# Patient Record
Sex: Male | Born: 1935 | Race: White | Hispanic: No | Marital: Married | Smoking: Never smoker
Health system: Southern US, Community
[De-identification: ages and names within clinical notes are randomized; demographics above are authoritative.]

## PROBLEM LIST (undated history)

## (undated) DIAGNOSIS — C189 Malignant neoplasm of colon, unspecified: Secondary | ICD-10-CM

## (undated) DIAGNOSIS — G4730 Sleep apnea, unspecified: Secondary | ICD-10-CM

## (undated) DIAGNOSIS — K08109 Complete loss of teeth, unspecified cause, unspecified class: Secondary | ICD-10-CM

## (undated) DIAGNOSIS — K000 Anodontia: Secondary | ICD-10-CM

## (undated) DIAGNOSIS — R410 Disorientation, unspecified: Secondary | ICD-10-CM

## (undated) DIAGNOSIS — K5790 Diverticulosis of intestine, part unspecified, without perforation or abscess without bleeding: Secondary | ICD-10-CM

## (undated) DIAGNOSIS — I639 Cerebral infarction, unspecified: Secondary | ICD-10-CM

## (undated) DIAGNOSIS — E119 Type 2 diabetes mellitus without complications: Secondary | ICD-10-CM

## (undated) DIAGNOSIS — M109 Gout, unspecified: Secondary | ICD-10-CM

## (undated) DIAGNOSIS — IMO0001 Reserved for inherently not codable concepts without codable children: Secondary | ICD-10-CM

## (undated) DIAGNOSIS — F32A Depression, unspecified: Secondary | ICD-10-CM

## (undated) DIAGNOSIS — I2510 Atherosclerotic heart disease of native coronary artery without angina pectoris: Secondary | ICD-10-CM

## (undated) DIAGNOSIS — F329 Major depressive disorder, single episode, unspecified: Secondary | ICD-10-CM

## (undated) DIAGNOSIS — E7800 Pure hypercholesterolemia, unspecified: Secondary | ICD-10-CM

## (undated) DIAGNOSIS — I10 Essential (primary) hypertension: Secondary | ICD-10-CM

## (undated) DIAGNOSIS — G629 Polyneuropathy, unspecified: Secondary | ICD-10-CM

## (undated) DIAGNOSIS — H9190 Unspecified hearing loss, unspecified ear: Secondary | ICD-10-CM

## (undated) HISTORY — PX: COLON RESECTION: SHX5231

## (undated) HISTORY — PX: COLONOSCOPY: SHX174

## (undated) HISTORY — PX: CARDIAC CATHETERIZATION: SHX172

---

## 2004-10-01 ENCOUNTER — Ambulatory Visit: Admission: RE | Admit: 2004-10-01 | Discharge: 2004-10-01 | Payer: Self-pay | Admitting: Ophthalmology

## 2007-06-28 ENCOUNTER — Ambulatory Visit (HOSPITAL_COMMUNITY): Admission: RE | Admit: 2007-06-28 | Discharge: 2007-06-28 | Payer: Self-pay | Admitting: Ophthalmology

## 2007-07-06 ENCOUNTER — Encounter (INDEPENDENT_AMBULATORY_CARE_PROVIDER_SITE_OTHER): Payer: Self-pay | Admitting: Ophthalmology

## 2007-07-06 ENCOUNTER — Ambulatory Visit (HOSPITAL_COMMUNITY): Admission: RE | Admit: 2007-07-06 | Discharge: 2007-07-06 | Payer: Self-pay | Admitting: Ophthalmology

## 2011-05-11 NOTE — Op Note (Signed)
NAME:  Stanley Zuniga, Stanley Zuniga              ACCOUNT NO.:  [id number]   MEDICAL RECORD NO.:  [id number]          PATIENT TYPE:  AMB   LOCATION:  SDS                          FACILITY:  [facility]   PHYSICIAN:  Timothy R. Vonna Kotyk, MD   DATE OF BIRTH:  [birth year]   DATE OF PROCEDURE:  DATE OF DISCHARGE:  07/06/2007                               OPERATIVE REPORT   PREOPERATIVE DIAGNOSIS:  Blind, painful eye, left eye.   POSTOPERATIVE DIAGNOSIS:  Blind, painful eye, left eye.   PROCEDURES PERFORMED:  Enucleation of the left eye with a 22-mm  prosthetic  placed into the left eye.   ANESTHESIA:  General anesthesia.   INDICATIONS FOR PROCEDURE:  This is a 76 year old gentleman with a left  eye which has been blind and painful and does not respond to  medications.  Risks and benefits of having the eye removed with the  patient.  The patient elected to have the eye removed.   DESCRIPTION OF PROCEDURE:  The patient was identified preoperatively,  __________ and the left eye was identified bythe surgeon.  The patient  was brought back to the operating suite, released and markers were  placed, and the patient was placed under general anesthesia.  At that  time, the eye was prepped and draped in the usual sterile fashion for  microphthalmic surgery, including __________ with povidone iodine to the  superior and inferior conjunctival fornices.  A __________ was placed.  Surgery was initiated.  A 360-degree conjunctival peritomy was performed  with the use of 0.12 Westcott scissors.  The conjunctiva and sclera were  dissected off the globe with the use of Westcott scissors.  The inferior  rectus was then isolated with a muscle hook and was imbricated with a  double-armed 6-0 Vicryl suture.  The muscle was then removed from the  eyeball, and this was continued with the medial rectus, superior rectus,  and lateral rectus.  Once all 4 recti muscles were isolated and  imbricated, the oblique muscles were removed  from the globe, and the  globe was removed from the socket.  The optic nerve was cut with  scissors.  The specimen was sent off to pathology.   Hemostasis was achieved with thrombin and pressure hemostasis.  Once the  hemostasis was achieved, the eyeball was measured in its greatest  diameter, and transverse diameter was 25 mm, and a 22-mm prosthetic was  placed into the socket.  The medial rectus was tied to the lateral  rectus, and the inferior rectus was tied to the superior rectus without  complication.  The Tenons were then sutured closed in a locking running  fashion with 8-0 Vicryl, and then the conjunctiva was closed with 8-0  Vicryl in a running fashion.  The patient then received intravenous  Avelox 400 mg and subconjunctival gentamicin, dexamethasone, and  lidocaine.  TobraDex ointment was then placed in the eye.  A conformer was placed in the eye, and the eye was pressure patched, and  the patient was taken to the recovery room in stable condition.   INSTRUCTIONS:  Follow up with Dr. Daphine Deutscher,  postop day 1 care.           ______________________________  Ozzie Hoyle. Vonna Kotyk, MD     TRB/MEDQ  D:  07/06/2007  T:  07/07/2007  Job:  [id number]

## 2011-10-12 LAB — CBC
HCT: 37.2 — ABNORMAL LOW
Hemoglobin: 12.8 — ABNORMAL LOW
MCHC: 34.5
MCV: 90.1
Platelets: 143 — ABNORMAL LOW
RBC: 4.13 — ABNORMAL LOW
RDW: 17.2 — ABNORMAL HIGH
WBC: 3.8 — ABNORMAL LOW

## 2011-10-12 LAB — COMPREHENSIVE METABOLIC PANEL
ALT: 14
AST: 24
Albumin: 4
Alkaline Phosphatase: 32 — ABNORMAL LOW
BUN: 14
CO2: 25
Calcium: 9.1
Chloride: 106
Creatinine, Ser: 0.75
GFR calc Af Amer: >60
GFR calc non Af Amer: >60
Glucose, Bld: 90
Potassium: 4.1
Sodium: 135
Total Bilirubin: 0.6
Total Protein: 6.8

## 2011-10-12 LAB — PROTIME-INR
INR: 1.1
Prothrombin Time: 14.1

## 2011-10-12 LAB — APTT: aPTT: 29

## 2016-07-29 ENCOUNTER — Encounter: Payer: Self-pay | Admitting: *Deleted

## 2016-08-06 NOTE — Discharge Instructions (Signed)
INSTRUCTIONS FOLLOWING OCULOPLASTIC SURGERY °AMY M. FOWLER, MD ° °AFTER YOUR EYE SURGERY, THER ARE MANY THINGS THWIHC YOU, THE PATIENT, CAN DO TO ASSURE THE BEST POSSIBLE RESULT FROM YOUR OPERATION.  THIS SHEET SHOULD BE REFERRED TO WHENEVER QUESTIONS ARISE.  IF THERE ARE ANY QUESTIONS NOT ANSWERED HERE, DO NOT HESITATE TO CALL OUR OFFICE AT [phone] OR [phone].  THERE IS ALWAYS [facility] AVAILABLE TO CALL IF QUESTIONS OR PROBLEMS ARISE. ° °VISION: Your vision may be blurred and out of focus after surgery until you are able to stop using your ointment, swelling resolves and your eye(s) heal. This may take 1 to 2 weeks at the least.  If your vision becomes gradually more dim or dark, this is not normal and you need to call our office immediately. ° °EYE CARE: For the first 48 hours after surgery, use ice packs frequently - “20 minutes on, 20 minutes off” - to help reduce swelling and bruising.  Small bags of frozen peas or corn make good ice packs along with cloths soaked in ice water.  If you are wearing a patch or other type of dressing following surgery, keep this on for the amount of time specified by your doctor.  For the first week following surgery, you will need to treat your stitches with great care.  If is OK to shower, but take care to not allow soapy water to run into your eye(s) to help reduce changes of infection.  You may gently clean the eyelashes and around the eye(s) with cotton balls and sterile water, BUT DO NOT RUB THE STITCHES VIGOROUSLY.  Keeping your stitches moist with ointment will help promote healing with minimal scar formation. ° °ACTIVITY: When you leave the surgery center, you should go home, rest and be inactive.  The eye(s) may feel scratchy and keeping the eyes closed will allow for faster healing.  The first week following surgery, avoid straining (anything making the face turn red) or lifting over 20 pounds.  Additionally, avoid bending which causes your head to go below  your waist.  Using your eyes will NOT harm them, so feel free to read, watch television, use the computer, etc as desired.  Driving depends on each individual, so check with your doctor if you have questions about driving. ° °MEDICATIONS:  You will be given a prescription for an ointment to use 4 times a day on your stitches.  You can use the ointment in your eyes if they feel scratchy or irritated.  If you eyelid(s) don’t close completely when you sleep, put some ointment in your eyes before bedtime. ° °EMERGENCY: If you experience SEVERE EYE PAIN OR HEADACHE UNRELIEVED BY TYLENOL OR PERCOCET, NAUSEA OR VOMITING, WORSENING REDNESS, OR WORSENING VISION (ESPECIALLY VISION THAT WA INITIALLY BETTER) CALL [phone] OR [phone] DURING BUSINESS HOURS OR AFTER HOURS. ° °General Anesthesia, Adult, Care After °Refer to this sheet in the next few weeks. These instructions provide you with information on caring for yourself after your procedure. Your health care provider may also give you more specific instructions. Your treatment has been planned according to current medical practices, but problems sometimes occur. Call your health care provider if you have any problems or questions after your procedure. °WHAT TO EXPECT AFTER THE PROCEDURE °After the procedure, it is typical to experience: °· Sleepiness. °· Nausea and vomiting. °HOME CARE INSTRUCTIONS °· For the first 24 hours after general anesthesia: °¨ Have a responsible person with you. °¨ Do not drive a car. If you   are alone, do not take public transportation. °¨ Do not drink alcohol. °¨ Do not take medicine that has not been prescribed by your health care provider. °¨ Do not sign important papers or make important decisions. °¨ You may resume a normal diet and activities as directed by your health care provider. °· Change bandages (dressings) as directed. °· If you have questions or problems that seem related to general anesthesia, call the hospital and ask for  the anesthetist or anesthesiologist on call. °SEEK MEDICAL CARE IF: °· You have nausea and vomiting that continue the day after anesthesia. °· You develop a rash. °SEEK IMMEDIATE MEDICAL CARE IF:  °· You have difficulty breathing. °· You have chest pain. °· You have any allergic problems. °  °This information is not intended to replace advice given to you by your health care provider. Make sure you discuss any questions you have with your health care provider. °  °Document Released: 03/21/2001 Document Revised: 01/03/2015 Document Reviewed: 04/12/2012 °Elsevier Interactive Patient Education ©2016 [employer] ° °

## 2016-08-10 ENCOUNTER — Ambulatory Visit: Payer: Medicare Other | Admitting: Student in an Organized Health Care Education/Training Program

## 2016-08-10 ENCOUNTER — Encounter: Payer: Self-pay | Admitting: Anesthesiology

## 2016-08-10 ENCOUNTER — Encounter
Admission: RE | Disposition: A | Discharge status: Home / Self Care | Payer: Self-pay | Source: Ambulatory Visit | Attending: Ophthalmology

## 2016-08-10 ENCOUNTER — Ambulatory Visit
Admission: RE | Admit: 2016-08-10 | Discharge: 2016-08-10 | Disposition: A | Discharge status: Home / Self Care | Payer: Medicare Other | Source: Ambulatory Visit | Attending: Ophthalmology | Admitting: Ophthalmology

## 2016-08-10 DIAGNOSIS — M109 Gout, unspecified: Secondary | ICD-10-CM | POA: Insufficient documentation

## 2016-08-10 DIAGNOSIS — K5790 Diverticulosis of intestine, part unspecified, without perforation or abscess without bleeding: Secondary | ICD-10-CM | POA: Diagnosis not present

## 2016-08-10 DIAGNOSIS — G4730 Sleep apnea, unspecified: Secondary | ICD-10-CM | POA: Insufficient documentation

## 2016-08-10 DIAGNOSIS — Z8673 Personal history of transient ischemic attack (TIA), and cerebral infarction without residual deficits: Secondary | ICD-10-CM | POA: Insufficient documentation

## 2016-08-10 DIAGNOSIS — I2510 Atherosclerotic heart disease of native coronary artery without angina pectoris: Secondary | ICD-10-CM | POA: Diagnosis not present

## 2016-08-10 DIAGNOSIS — Z885 Allergy status to narcotic agent status: Secondary | ICD-10-CM | POA: Insufficient documentation

## 2016-08-10 DIAGNOSIS — Z955 Presence of coronary angioplasty implant and graft: Secondary | ICD-10-CM | POA: Insufficient documentation

## 2016-08-10 DIAGNOSIS — I10 Essential (primary) hypertension: Secondary | ICD-10-CM | POA: Diagnosis not present

## 2016-08-10 DIAGNOSIS — F329 Major depressive disorder, single episode, unspecified: Secondary | ICD-10-CM | POA: Diagnosis not present

## 2016-08-10 DIAGNOSIS — Z9889 Other specified postprocedural states: Secondary | ICD-10-CM | POA: Diagnosis not present

## 2016-08-10 DIAGNOSIS — Z79899 Other long term (current) drug therapy: Secondary | ICD-10-CM | POA: Insufficient documentation

## 2016-08-10 DIAGNOSIS — Z7984 Long term (current) use of oral hypoglycemic drugs: Secondary | ICD-10-CM | POA: Insufficient documentation

## 2016-08-10 DIAGNOSIS — Z7982 Long term (current) use of aspirin: Secondary | ICD-10-CM | POA: Insufficient documentation

## 2016-08-10 DIAGNOSIS — Z85038 Personal history of other malignant neoplasm of large intestine: Secondary | ICD-10-CM | POA: Diagnosis not present

## 2016-08-10 DIAGNOSIS — H02105 Unspecified ectropion of left lower eyelid: Secondary | ICD-10-CM | POA: Diagnosis not present

## 2016-08-10 DIAGNOSIS — E1140 Type 2 diabetes mellitus with diabetic neuropathy, unspecified: Secondary | ICD-10-CM | POA: Diagnosis not present

## 2016-08-10 DIAGNOSIS — H02402 Unspecified ptosis of left eyelid: Secondary | ICD-10-CM | POA: Diagnosis not present

## 2016-08-10 DIAGNOSIS — E7800 Pure hypercholesterolemia, unspecified: Secondary | ICD-10-CM | POA: Insufficient documentation

## 2016-08-10 DIAGNOSIS — Z9049 Acquired absence of other specified parts of digestive tract: Secondary | ICD-10-CM | POA: Diagnosis not present

## 2016-08-10 HISTORY — DX: Gout, unspecified: M10.9

## 2016-08-10 HISTORY — DX: Atherosclerotic heart disease of native coronary artery without angina pectoris: I25.10

## 2016-08-10 HISTORY — DX: Essential (primary) hypertension: I10

## 2016-08-10 HISTORY — DX: Type 2 diabetes mellitus without complications: E11.9

## 2016-08-10 HISTORY — DX: Sleep apnea, unspecified: G47.30

## 2016-08-10 HISTORY — DX: Major depressive disorder, single episode, unspecified: F32.9

## 2016-08-10 HISTORY — PX: ENTROPIAN REPAIR: SHX1512

## 2016-08-10 HISTORY — DX: Unspecified hearing loss, unspecified ear: H91.90

## 2016-08-10 HISTORY — DX: Reserved for inherently not codable concepts without codable children: IMO0001

## 2016-08-10 HISTORY — DX: Disorientation, unspecified: R41.0

## 2016-08-10 HISTORY — DX: Complete loss of teeth, unspecified cause, unspecified class: K08.109

## 2016-08-10 HISTORY — PX: PTOSIS REPAIR: SHX6568

## 2016-08-10 HISTORY — DX: Depression, unspecified: F32.A

## 2016-08-10 HISTORY — DX: Diverticulosis of intestine, part unspecified, without perforation or abscess without bleeding: K57.90

## 2016-08-10 HISTORY — DX: Pure hypercholesterolemia, unspecified: E78.00

## 2016-08-10 HISTORY — DX: Malignant neoplasm of colon, unspecified: C18.9

## 2016-08-10 HISTORY — DX: Polyneuropathy, unspecified: G62.9

## 2016-08-10 HISTORY — DX: Anodontia: K00.0

## 2016-08-10 LAB — GLUCOSE, CAPILLARY
GLUCOSE-CAPILLARY: 93 mg/dL (ref 65–99)
Glucose-Capillary: 89 mg/dL (ref 65–99)

## 2016-08-10 SURGERY — REPAIR, BLEPHAROPTOSIS
Anesthesia: Monitor Anesthesia Care | Site: Eye | Laterality: Left | Wound class: Clean

## 2016-08-10 MED ORDER — ERYTHROMYCIN 5 MG/GM OP OINT
TOPICAL_OINTMENT | OPHTHALMIC | Status: DC | PRN
  Administered 2016-08-10: 1 via OPHTHALMIC

## 2016-08-10 MED ORDER — LACTATED RINGERS IV SOLN
INTRAVENOUS | Status: DC
  Administered 2016-08-10: 07:00:00 via INTRAVENOUS

## 2016-08-10 MED ORDER — LIDOCAINE-EPINEPHRINE 2 %-1:100000 IJ SOLN
INTRAMUSCULAR | Status: DC | PRN
  Administered 2016-08-10: 2.5 mL via OPHTHALMIC

## 2016-08-10 MED ORDER — BSS IO SOLN
INTRAOCULAR | Status: DC | PRN
  Administered 2016-08-10: 15 mL via INTRAOCULAR

## 2016-08-10 MED ORDER — TETRACAINE HCL 0.5 % OP SOLN
OPHTHALMIC | Status: DC | PRN
  Administered 2016-08-10: 1 [drp] via OPHTHALMIC
  Administered 2016-08-10 (×2): 2 [drp] via OPHTHALMIC

## 2016-08-10 MED ORDER — OXYCODONE-ACETAMINOPHEN 5-325 MG PO TABS: 1.0000 | ORAL_TABLET | ORAL | 0 refills | Status: DC | PRN

## 2016-08-10 MED ORDER — LIDOCAINE HCL (CARDIAC) 20 MG/ML IV SOLN
INTRAVENOUS | Status: DC | PRN
  Administered 2016-08-10: 10 mg via INTRAVENOUS

## 2016-08-10 MED ORDER — PROPOFOL 500 MG/50ML IV EMUL
INTRAVENOUS | Status: DC | PRN
  Administered 2016-08-10: 25 ug/kg/min via INTRAVENOUS

## 2016-08-10 MED ORDER — ALFENTANIL 500 MCG/ML IJ INJ
INJECTION | INTRAMUSCULAR | Status: DC | PRN
  Administered 2016-08-10: 300 ug via INTRAVENOUS

## 2016-08-10 MED ORDER — ERYTHROMYCIN 5 MG/GM OP OINT: TOPICAL_OINTMENT | OPHTHALMIC | 3 refills | Status: DC

## 2016-08-10 SURGICAL SUPPLY — 38 items
APPLICATOR COTTON TIP WD 3 STR (MISCELLANEOUS) ×6 IMPLANT
BLADE SURG 15 STRL LF DISP TIS (BLADE) ×1 IMPLANT
BLADE SURG 15 STRL SS (BLADE) ×3
CORD BIP STRL DISP 12FT (MISCELLANEOUS) ×3 IMPLANT
DRAPE HEAD BAR (DRAPES) ×3 IMPLANT
GAUZE SPONGE 4X4 12PLY STRL (GAUZE/BANDAGES/DRESSINGS) ×3 IMPLANT
GAUZE SPONGE NON-WVN 2X2 STRL (MISCELLANEOUS) ×10 IMPLANT
GLOVE SURG LX 7.0 MICRO (GLOVE) ×4
GLOVE SURG LX STRL 7.0 MICRO (GLOVE) ×2 IMPLANT
MARKER SKIN XFINE TIP W/RULER (MISCELLANEOUS) ×3 IMPLANT
NDL FILTER BLUNT 18X1 1/2 (NEEDLE) ×1 IMPLANT
NDL HYPO 30X.5 LL (NEEDLE) ×2 IMPLANT
NEEDLE FILTER BLUNT 18X 1/2SAF (NEEDLE) ×2
NEEDLE FILTER BLUNT 18X1 1/2 (NEEDLE) ×1 IMPLANT
NEEDLE HYPO 30X.5 LL (NEEDLE) ×6 IMPLANT
PACK DRAPE NASAL/ENT (PACKS) ×3 IMPLANT
SOL PREP PVP 2OZ (MISCELLANEOUS) ×3
SOLUTION PREP PVP 2OZ (MISCELLANEOUS) ×1 IMPLANT
SPONGE VERSALON 2X2 STRL (MISCELLANEOUS) ×30
SUT CHROMIC 4-0 (SUTURE)
SUT CHROMIC 4-0 M2 12X2 ARM (SUTURE)
SUT CHROMIC 5 0 P 3 (SUTURE) IMPLANT
SUT ETHILON 4 0 CL P 3 (SUTURE) IMPLANT
SUT MERSILENE 4-0 S-2 (SUTURE) ×3 IMPLANT
SUT PDS AB 4-0 P3 18 (SUTURE) IMPLANT
SUT PLAIN GUT (SUTURE) ×3 IMPLANT
SUT PROLENE 5 0 P 3 (SUTURE) ×3 IMPLANT
SUT PROLENE 6 0 P 1 18 (SUTURE) ×3 IMPLANT
SUT SILK 4 0 G 3 (SUTURE) IMPLANT
SUT VIC AB 5-0 P-3 18X BRD (SUTURE) IMPLANT
SUT VIC AB 5-0 P3 18 (SUTURE)
SUT VICRYL 6-0  S14 CTD (SUTURE)
SUT VICRYL 6-0 S14 CTD (SUTURE) IMPLANT
SUT VICRYL 7 0 TG140 8 (SUTURE) IMPLANT
SUTURE CHRMC 4-0 M2 12X2 ARM (SUTURE) IMPLANT
SYR 3ML LL SCALE MARK (SYRINGE) ×3 IMPLANT
SYRINGE 10CC LL (SYRINGE) ×3 IMPLANT
WATER STERILE IRR 500ML POUR (IV SOLUTION) ×3 IMPLANT

## 2016-08-10 NOTE — Anesthesia Procedure Notes (Signed)
Procedure Name: MAC Date/Time: 08/10/2016 7:37 AM Performed by: Janna Arch Pre-anesthesia Checklist: Patient identified, Emergency Drugs available, Suction available, Patient being monitored and Timeout performed Patient Re-evaluated:Patient Re-evaluated prior to inductionOxygen Delivery Method: Nasal cannula

## 2016-08-10 NOTE — Anesthesia Preprocedure Evaluation (Addendum)
Anesthesia Evaluation    Airway Mallampati: III  TM Distance: >3 FB Neck ROM: Limited    Dental   Pulmonary shortness of breath and with exertion, sleep apnea and Continuous Positive Airway Pressure Ventilation ,    Pulmonary exam normal        Cardiovascular hypertension, Pt. on medications + CAD  Normal cardiovascular exam     Neuro/Psych PSYCHIATRIC DISORDERS Depression    GI/Hepatic   Endo/Other  diabetes, Well Controlled, Type 2  Renal/GU      Musculoskeletal   Abdominal   Peds  Hematology   Anesthesia Other Findings   Reproductive/Obstetrics                             Anesthesia Physical Anesthesia Plan  ASA: III  Anesthesia Plan: MAC   Post-op Pain Management:    Induction: Intravenous  Airway Management Planned:   Additional Equipment:   Intra-op Plan:   Post-operative Plan:   Informed Consent: I have reviewed the patients History and Physical, chart, labs and discussed the procedure including the risks, benefits and alternatives for the proposed anesthesia with the patient or authorized representative who has indicated his/her understanding and acceptance.     Plan Discussed with: CRNA  Anesthesia Plan Comments:         Anesthesia Quick Evaluation

## 2016-08-10 NOTE — H&P (Signed)
  See the history and physical completed at [facility] on 07/21/16 and scanned into the chart.

## 2016-08-10 NOTE — Transfer of Care (Signed)
Immediate Anesthesia Transfer of Care Note  Patient: Stanley Zuniga  Procedure(s) Performed: Procedure(s) with comments: PTOSIS REPAIR/ UPPER LID (Left) REPAIR OF ENTROPION/ LOWER LID (Left) - Diabetic - oral meds Sleep apnea  Patient Location: PACU  Anesthesia Type: MAC  Level of Consciousness: awake, alert  and patient cooperative  Airway and Oxygen Therapy: Patient Spontanous Breathing and Patient connected to supplemental oxygen  Post-op Assessment: Post-op Vital signs reviewed, Patient's Cardiovascular Status Stable, Respiratory Function Stable, Patent Airway and No signs of Nausea or vomiting  Post-op Vital Signs: Reviewed and stable  Complications: No apparent anesthesia complications

## 2016-08-10 NOTE — Op Note (Signed)
Preoperative Diagnosis:  1. Visually significant blepharoptosis left Upper Eyelid(s) 2.  Lower eyelid laxity with ectropion,  left  lower eyelid(s).  Postoperative Diagnosis:  Same.  Procedure(s) Performed:   1. Blepharoptosis repair with levator aponeurosis advancement left Upper Eyelid(s) 2. Lateral tarsal strip procedure,  left  lower eyelid(s).  Teaching Surgeon: Philis Pique. Vickki Muff, M.D.  Assistants: none  Anesthesia: MAC  Specimens: None.  Estimated Blood Loss: Minimal.  Complications: None.  Operative Findings: None Dictated  Procedure:   Allergies were reviewed and the patient is allergic to codeine..    After the risks, benefits, complications and alternatives were discussed with the patient, appropriate informed consent was obtained.  While seated in an upright position and looking in primary gaze, the mid pupillary line was marked on the upper eyelid margins bilaterally. The patient was then brought to the operating suite and reclined supine.  Timeout was conducted and the patient was sedated.  Local anesthetic consisting of a 50-50 mixture of 2% lidocaine with epinephrine and 0.75% bupivacaine with added Hylenex was injected subcutaneously to the left upper eyelid(s). Additional anesthetic was  injected subcutaneously to the left lateral canthal region(s) and lower eyelid(s). Additional anesthetic was injected subconjunctivally to the left lower eyelid(s). Finally, anesthetic was injected down to the periosteum of the left lateral orbital rim(s). After adequate local was instilled, the patient was prepped and draped in the usual sterile fashion for eyelid surgery.    Attention was turned to the left lateral canthal angle. Westcott scissors were used to create a lateral canthotomy. Hemostasis was obtained with bipolar cautery. An inferior cantholysis was then performed with additional bipolar hemostasis. The anterior and posterior lamella of the lid were divided for  approximately 8 mm.  A strip of the epithelium was excised off the superior margin of the tarsal strip and conjunctiva and retractors were incised off the inferior margin of the tarsal strip. A double-armed 4-0 Mersilene suture was then passed each arm through the terminal portion of the tarsal strip. Each arm of the suture was then passed through the periosteum of the inner portion of the lateral orbital rim at the level of Whitnall's tubercle. The sutures were advanced and this provided nice elevation and tightening of the lower eyelid. Once the suture was secured, a thin strip of follicle-bearing skin was excised. The lateral canthal angle was reformed with an interrupted 6-0 fast absorbing plain suture. Orbicularis was reapproximated with horizontal subcuticular 6-0 fast absorbing plain gut sutures. The skin was closed with interrupted 6-0 fast absorbing plain gut sutures.   Attention was turned to the upper eyelids. A 65mm upper eyelid crease incision line was marked with calipers on the left upper eyelid(s).   A #15 blade was used to open the premarked incision line.   Westcott scissors were then used to transect through orbicularis down to the tarsal plate. Epitarsus was dissected to create a smooth surface to suture to. Dissection was then carried superiorly in the plane between orbicularis and orbital septum. Once the preaponeurotic fat pocket was identified, the orbital septum was opened. This revealed the levator and its aponeurosis.    Hemostasis was obtained with bipolar cautery throughout.   3 interrupted 6-0 Prolene sutures were then passed partial thickness through the tarsal plates of the left upper eyelid(s). These sutures were placed in line with the mid pupillary, medial limbal, and lateral limbal lines. The sutures were fixed to the levator aponeurosis and adjusted until a nice lid height and contour were achieved.  Once nice symmetry was achieved, the skin incisions were closed with a  running 6-0 fast absorbing plain suture.   The patient tolerated the procedure well.  Erythromycin ophthalmic ointment was applied to the incision site(s) followed by ice packs. The patient was taken to the recovery area where he recovered without difficulty.  Post-Op Plan/Instructions:  The patient was instructed to use ice packs frequently for the next 48 hours. He was instructed to use erythromycin ointment on His incisions 4 times a day for the next 12 to 14 days. He was given a prescription for Percocet for pain control should Tylenol not be effective. He was asked to follow up at the [facility] in [place], [place]  in 2 weeks' time or sooner as needed for problems.  Teaching Surgeon Attestation: None  Sudais Banghart M. Vickki Muff, M.D. Attending,Ophthalmology

## 2016-08-10 NOTE — Interval H&P Note (Signed)
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 After consideration of risks, benefits and other options for treatment, the patient has consented to  Procedure(s) with comments: PTOSIS REPAIR (Left) REPAIR OF ENTROPION (Left) - Diabetic - oral meds Sleep apnea as a surgical intervention .  The patient's history has been reviewed, patient examined, no change in status, stable for surgery.  I have reviewed the patient's chart and labs.  Questions were answered to the patient's satisfaction.     Vickki Muff, Doninique Lwin M

## 2016-08-10 NOTE — Anesthesia Postprocedure Evaluation (Signed)
Anesthesia Post Note  Patient: Stanley Zuniga  Procedure(s) Performed: Procedure(s) (LRB): PTOSIS REPAIR/ UPPER LID (Left) REPAIR OF ENTROPION/ LOWER LID (Left)  Patient location during evaluation: PACU Anesthesia Type: MAC Level of consciousness: awake and alert Pain management: pain level controlled Vital Signs Assessment: post-procedure vital signs reviewed and stable Respiratory status: spontaneous breathing, nonlabored ventilation, respiratory function stable and patient connected to nasal cannula oxygen Cardiovascular status: stable and blood pressure returned to baseline Anesthetic complications: no    Marshell Levan

## 2016-08-11 ENCOUNTER — Encounter: Payer: Self-pay | Admitting: Ophthalmology

## 2019-07-09 ENCOUNTER — Emergency Department (HOSPITAL_COMMUNITY)
Admission: EM | Admit: 2019-07-09 | Discharge: 2019-07-09 | Disposition: A | Discharge status: Skilled Nursing Facility | Payer: Medicare Other | Attending: Emergency Medicine | Admitting: Emergency Medicine

## 2019-07-09 ENCOUNTER — Other Ambulatory Visit: Payer: Self-pay

## 2019-07-09 ENCOUNTER — Emergency Department (HOSPITAL_COMMUNITY): Payer: Medicare Other

## 2019-07-09 ENCOUNTER — Encounter (HOSPITAL_COMMUNITY): Payer: Self-pay | Admitting: *Deleted

## 2019-07-09 DIAGNOSIS — W19XXXA Unspecified fall, initial encounter: Secondary | ICD-10-CM | POA: Diagnosis not present

## 2019-07-09 DIAGNOSIS — E119 Type 2 diabetes mellitus without complications: Secondary | ICD-10-CM | POA: Diagnosis not present

## 2019-07-09 DIAGNOSIS — S0990XA Unspecified injury of head, initial encounter: Secondary | ICD-10-CM | POA: Diagnosis present

## 2019-07-09 DIAGNOSIS — Z7982 Long term (current) use of aspirin: Secondary | ICD-10-CM | POA: Insufficient documentation

## 2019-07-09 DIAGNOSIS — Y939 Activity, unspecified: Secondary | ICD-10-CM | POA: Insufficient documentation

## 2019-07-09 DIAGNOSIS — I10 Essential (primary) hypertension: Secondary | ICD-10-CM | POA: Insufficient documentation

## 2019-07-09 DIAGNOSIS — Y929 Unspecified place or not applicable: Secondary | ICD-10-CM | POA: Diagnosis not present

## 2019-07-09 DIAGNOSIS — S0093XA Contusion of unspecified part of head, initial encounter: Secondary | ICD-10-CM | POA: Diagnosis not present

## 2019-07-09 DIAGNOSIS — Y999 Unspecified external cause status: Secondary | ICD-10-CM | POA: Diagnosis not present

## 2019-07-09 DIAGNOSIS — Z79899 Other long term (current) drug therapy: Secondary | ICD-10-CM | POA: Insufficient documentation

## 2019-07-09 DIAGNOSIS — I2510 Atherosclerotic heart disease of native coronary artery without angina pectoris: Secondary | ICD-10-CM | POA: Insufficient documentation

## 2019-07-09 HISTORY — DX: Cerebral infarction, unspecified: I63.9

## 2019-07-09 NOTE — ED Provider Notes (Signed)
[facility] EMERGENCY DEPARTMENT Provider Note   CSN: [id number] Arrival date & time: 07/09/19  2044     History   Chief Complaint Chief Complaint  Patient presents with   Fall    HPI Stanley Zuniga is a 85 y.o. male.     Patient fell and hit the top of his head questionable LOC  The history is provided by the EMS personnel. No language interpreter was used.  Fall This is a new problem. The current episode started 12 to 24 hours ago. The problem occurs rarely. The problem has been resolved. Pertinent negatives include no chest pain, no abdominal pain and no headaches. Nothing aggravates the symptoms. Nothing relieves the symptoms. He has tried nothing for the symptoms. The treatment provided no relief.    Past Medical History:  Diagnosis Date   Cerebral infarction [facility])    Colon cancer ([place])    Confusion    started on donepezil recently   Coronary artery disease    Depression    Diabetes mellitus without complication ([place])    Diverticulosis    Gout    HOH (hard of hearing)    has aid for left ear - does not wear   Hypercholesteremia    Hypertension    Neuropathy    legs due to DM   No natural teeth    Shortness of breath dyspnea    Sleep apnea    CPAP    There are no active problems to display for this patient.   Past Surgical History:  Procedure Laterality Date   CARDIAC CATHETERIZATION     stents - over 5 yrs ago   COLON RESECTION     COLONOSCOPY     ENTROPIAN REPAIR Left 08/10/2016   Procedure: REPAIR OF ENTROPION/ LOWER LID;  Surgeon: Karle Starch, MD;  Location: [place];  Service: Ophthalmology;  Laterality: Left;  Diabetic - oral meds Sleep apnea   PTOSIS REPAIR Left 08/10/2016   Procedure: PTOSIS REPAIR/ UPPER LID;  Surgeon: Karle Starch, MD;  Location: [place];  Service: Ophthalmology;  Laterality: Left;        Home Medications    Prior to Admission medications   Medication Sig Start Date End  Date Taking? Authorizing Provider  acetaminophen (TYLENOL) 325 MG tablet Take 975 mg by mouth 3 (three) times daily.   Yes [provider]  allopurinol (ZYLOPRIM) 100 MG tablet Take 100 mg by mouth daily.    Yes [provider]  aspirin 81 MG tablet Take 81 mg by mouth daily.   Yes [provider]  Cholecalciferol (VITAMIN D) 2000 units CAPS Take 1 capsule by mouth daily.    Yes [provider]  haloperidol (HALDOL) 2 MG/ML solution Take 1 mg by mouth 2 (two) times daily.   Yes [provider]  lipase/protease/amylase (CREON) 36000 UNITS CPEP capsule Take 36,000 Units by mouth 2 (two) times a day.   Yes [provider]  loperamide (IMODIUM A-D) 2 MG tablet Take 2 mg by mouth 2 (two) times a day.   Yes [provider]  losartan (COZAAR) 50 MG tablet Take 50 mg by mouth daily.   Yes [provider]  metFORMIN (GLUCOPHAGE-XR) 500 MG 24 hr tablet Take 500 mg by mouth daily with breakfast.   Yes [provider]  metoprolol tartrate (LOPRESSOR) 25 MG tablet Take 25 mg by mouth 2 (two) times daily.   Yes [provider]  sertraline (ZOLOFT) 50  MG tablet Take 50 mg by mouth daily.   Yes [provider]  tamsulosin (FLOMAX) 0.4 MG CAPS capsule Take 0.4 mg by mouth daily.    Yes [provider]    Family History No family history on file.  Social History Social History   Tobacco Use   Smoking status: Never Smoker   Smokeless tobacco: Never Used  Substance Use Topics   Alcohol use: No   Drug use: Not Currently     Allergies   Codeine   Review of Systems Review of Systems  Constitutional: Negative for appetite change and fatigue.  HENT: Negative for congestion, ear discharge and sinus pressure.   Eyes: Negative for discharge.  Respiratory: Negative for cough.   Cardiovascular: Negative for chest pain.  Gastrointestinal: Negative for abdominal pain and diarrhea.  Genitourinary:  Negative for frequency and hematuria.  Musculoskeletal: Negative for back pain.  Skin: Negative for rash.  Neurological: Negative for seizures and headaches.  Psychiatric/Behavioral: Negative for hallucinations.     Physical Exam Updated Vital Signs BP 101/61    Pulse 95    Temp 98.4 F (36.9 C) (Oral)    Resp 17    Ht 5\' 10"  (1.778 m)    Wt 98.9 kg    SpO2 96%    BMI 31.28 kg/m   Physical Exam Vitals signs reviewed.  Constitutional:      Appearance: He is well-developed.  HENT:     Head: Normocephalic.     Comments: Bruising the top of head    Nose: Nose normal.  Eyes:     General: No scleral icterus.    Conjunctiva/sclera: Conjunctivae normal.  Neck:     Musculoskeletal: Neck supple.     Thyroid: No thyromegaly.  Cardiovascular:     Rate and Rhythm: Normal rate and regular rhythm.     Heart sounds: No murmur. No friction rub. No gallop.   Pulmonary:     Breath sounds: No stridor. No wheezing or rales.  Chest:     Chest wall: No tenderness.  Abdominal:     General: There is no distension.     Tenderness: There is no abdominal tenderness. There is no rebound.  Musculoskeletal: Normal range of motion.  Lymphadenopathy:     Cervical: No cervical adenopathy.  Skin:    Findings: No erythema or rash.  Neurological:     Mental Status: He is alert.     Motor: No abnormal muscle tone.     Coordination: Coordination normal.     Comments: Oriented to person only.  Patient has dementia  Psychiatric:        Behavior: Behavior normal.      ED Treatments / Results  Labs (all labs ordered are listed, but only abnormal results are displayed) Labs Reviewed - No data to display  EKG None  Radiology Dg Pelvis 1-2 Views  Result Date: 07/09/2019 CLINICAL DATA:  Fall.  Found on floor. EXAM: PELVIS - 1-2 VIEW COMPARISON:  None. FINDINGS: No visible fracture, subluxation or dislocation. Hip joints and SI joints symmetric and unremarkable. Contrast material noted within the  bladder centrally in the pelvis. IMPRESSION: No acute bony abnormality. Electronically Signed   By: Rolm Baptise M.D.   On: 07/09/2019 21:49   Ct Head Wo Contrast  Result Date: 07/09/2019 CLINICAL DATA:  Fall on lying on floor with bruising to top of head EXAM: CT HEAD WITHOUT CONTRAST CT CERVICAL SPINE WITHOUT CONTRAST TECHNIQUE: Multidetector CT imaging of the head and  cervical spine was performed following the standard protocol without intravenous contrast. Multiplanar CT image reconstructions of the cervical spine were also generated. COMPARISON:  None. FINDINGS: CT HEAD FINDINGS Brain: No acute territorial infarction, hemorrhage or intracranial mass. Prominent atrophy. Chronic appearing lacunar infarcts within the bilateral basal ganglia. Chronic appearing infarct in the right frontal white matter. Symmetrical dilated extra-axial CSF densities over the frontal and lateral convexities, felt secondary to combination of atrophy and bilateral chronic subdural effusions. Ventricles are nonenlarged. Vascular: No hyperdense vessels. Vertebral and carotid vascular calcification Skull: Normal. Negative for fracture or focal lesion. Sinuses/Orbits: Left ocular prosthesis. Mucosal thickening in the sinuses. Other: Soft tissue swelling at the cranial vertex CT CERVICAL SPINE FINDINGS Alignment: No subluxation.  Facet alignment is within normal limits. Skull base and vertebrae: No acute fracture. No primary bone lesion or focal pathologic process. Soft tissues and spinal canal: No prevertebral fluid or swelling. No visible canal hematoma. Disc levels: Mild degenerative changes at C3-C4, C5-C6 and C6-C7. Posterior facet degenerative change at multiple levels. Upper chest: Lung apices are clear. 14 mm soft tissue nodule in the left preauricular area. Other: None IMPRESSION: 1. No CT evidence for acute intracranial abnormality. 2. Atrophy, small vessel ischemic changes of the white matter and multifocal chronic appearing  lacunar infarcts. Enlarged extra-axial CSF spaces bilaterally felt secondary to combination of atrophy and bilateral chronic subdural effusions. 3. No acute osseous abnormality of the cervical spine. Degenerative changes 4. 14 mm nonspecific oval soft tissue nodule in the left preauricular region. This could represent lymph node or other solid mass. Follow-up nonemergent MRI could be obtained as clinically indicated. Electronically Signed   By: Donavan Foil M.D.   On: 07/09/2019 22:09   Ct Cervical Spine Wo Contrast  Result Date: 07/09/2019 CLINICAL DATA:  Fall on lying on floor with bruising to top of head EXAM: CT HEAD WITHOUT CONTRAST CT CERVICAL SPINE WITHOUT CONTRAST TECHNIQUE: Multidetector CT imaging of the head and cervical spine was performed following the standard protocol without intravenous contrast. Multiplanar CT image reconstructions of the cervical spine were also generated. COMPARISON:  None. FINDINGS: CT HEAD FINDINGS Brain: No acute territorial infarction, hemorrhage or intracranial mass. Prominent atrophy. Chronic appearing lacunar infarcts within the bilateral basal ganglia. Chronic appearing infarct in the right frontal white matter. Symmetrical dilated extra-axial CSF densities over the frontal and lateral convexities, felt secondary to combination of atrophy and bilateral chronic subdural effusions. Ventricles are nonenlarged. Vascular: No hyperdense vessels. Vertebral and carotid vascular calcification Skull: Normal. Negative for fracture or focal lesion. Sinuses/Orbits: Left ocular prosthesis. Mucosal thickening in the sinuses. Other: Soft tissue swelling at the cranial vertex CT CERVICAL SPINE FINDINGS Alignment: No subluxation.  Facet alignment is within normal limits. Skull base and vertebrae: No acute fracture. No primary bone lesion or focal pathologic process. Soft tissues and spinal canal: No prevertebral fluid or swelling. No visible canal hematoma. Disc levels: Mild  degenerative changes at C3-C4, C5-C6 and C6-C7. Posterior facet degenerative change at multiple levels. Upper chest: Lung apices are clear. 14 mm soft tissue nodule in the left preauricular area. Other: None IMPRESSION: 1. No CT evidence for acute intracranial abnormality. 2. Atrophy, small vessel ischemic changes of the white matter and multifocal chronic appearing lacunar infarcts. Enlarged extra-axial CSF spaces bilaterally felt secondary to combination of atrophy and bilateral chronic subdural effusions. 3. No acute osseous abnormality of the cervical spine. Degenerative changes 4. 14 mm nonspecific oval soft tissue nodule in the left preauricular region. This could represent  lymph node or other solid mass. Follow-up nonemergent MRI could be obtained as clinically indicated. Electronically Signed   By: Donavan Foil M.D.   On: 07/09/2019 22:09    Procedures Procedures (including critical care time)  Medications Ordered in ED Medications - No data to display   Initial Impression / Assessment and Plan / ED Course  I have reviewed the triage vital signs and the nursing notes.  Pertinent labs & imaging results that were available during my care of the patient were reviewed by me and considered in my medical decision making (see chart for details).        CT of head shows no intracranial injury.  Patient does have a soft tissue mass near 1 of his ears.  Radiology recommended MRI on nonemergent basis.  He will follow-up with his primary provider  Final Clinical Impressions(s) / ED Diagnoses   Final diagnoses:  Fall, initial encounter    ED Discharge Orders    None       Milton Ferguson, MD 07/11/19 0900

## 2019-07-09 NOTE — ED Notes (Signed)
Pt unable to sign for self. Report given to Acily[facility] at [place]. They are unable to provide transportation. EMS called by secretary to get patient.

## 2019-07-09 NOTE — Discharge Instructions (Signed)
Follow-up with your family doctor 

## 2019-07-09 NOTE — ED Notes (Signed)
Pt taken to ct 

## 2019-07-09 NOTE — ED Triage Notes (Signed)
Pt brought in by [facility] for c/o fall; staff reported they found pt lying in floor beside his bed; pt was recently seen at [facility] for a yeast infection on bottom area and was given ativan at [place] before he was transported back to the [facility];

## 2019-07-09 NOTE — ED Notes (Signed)
See triage notes. Swelling and bruising noted to top of head. No other obvious deformities noted. Ems states pulled away when they was holding left arm but no obvious ss of pain with palpation in ED. Nad. Alert

## 2019-07-09 NOTE — ED Notes (Signed)
PATIENT FROM [facility]

## 2019-09-23 ENCOUNTER — Emergency Department: Payer: Medicare Other

## 2019-09-23 ENCOUNTER — Inpatient Hospital Stay
Admission: EM | Admit: 2019-09-23 | Discharge: 2019-09-27 | DRG: 208 | Disposition: E | Payer: Medicare Other | Source: Skilled Nursing Facility | Attending: Internal Medicine | Admitting: Internal Medicine

## 2019-09-23 ENCOUNTER — Encounter: Payer: Self-pay | Admitting: Emergency Medicine

## 2019-09-23 ENCOUNTER — Other Ambulatory Visit: Payer: Self-pay

## 2019-09-23 DIAGNOSIS — G4733 Obstructive sleep apnea (adult) (pediatric): Secondary | ICD-10-CM | POA: Diagnosis present

## 2019-09-23 DIAGNOSIS — E119 Type 2 diabetes mellitus without complications: Secondary | ICD-10-CM | POA: Diagnosis present

## 2019-09-23 DIAGNOSIS — J9601 Acute respiratory failure with hypoxia: Principal | ICD-10-CM | POA: Diagnosis present

## 2019-09-23 DIAGNOSIS — M109 Gout, unspecified: Secondary | ICD-10-CM | POA: Diagnosis present

## 2019-09-23 DIAGNOSIS — Z85038 Personal history of other malignant neoplasm of large intestine: Secondary | ICD-10-CM | POA: Diagnosis not present

## 2019-09-23 DIAGNOSIS — Z7982 Long term (current) use of aspirin: Secondary | ICD-10-CM | POA: Diagnosis not present

## 2019-09-23 DIAGNOSIS — F329 Major depressive disorder, single episode, unspecified: Secondary | ICD-10-CM | POA: Diagnosis present

## 2019-09-23 DIAGNOSIS — Z66 Do not resuscitate: Secondary | ICD-10-CM | POA: Diagnosis present

## 2019-09-23 DIAGNOSIS — I959 Hypotension, unspecified: Secondary | ICD-10-CM | POA: Diagnosis present

## 2019-09-23 DIAGNOSIS — E785 Hyperlipidemia, unspecified: Secondary | ICD-10-CM | POA: Diagnosis present

## 2019-09-23 DIAGNOSIS — Z515 Encounter for palliative care: Secondary | ICD-10-CM | POA: Diagnosis present

## 2019-09-23 DIAGNOSIS — E870 Hyperosmolality and hypernatremia: Secondary | ICD-10-CM | POA: Diagnosis present

## 2019-09-23 DIAGNOSIS — R7989 Other specified abnormal findings of blood chemistry: Secondary | ICD-10-CM

## 2019-09-23 DIAGNOSIS — I2510 Atherosclerotic heart disease of native coronary artery without angina pectoris: Secondary | ICD-10-CM | POA: Diagnosis present

## 2019-09-23 DIAGNOSIS — Z20828 Contact with and (suspected) exposure to other viral communicable diseases: Secondary | ICD-10-CM | POA: Diagnosis present

## 2019-09-23 DIAGNOSIS — Z8673 Personal history of transient ischemic attack (TIA), and cerebral infarction without residual deficits: Secondary | ICD-10-CM

## 2019-09-23 DIAGNOSIS — E860 Dehydration: Secondary | ICD-10-CM | POA: Diagnosis present

## 2019-09-23 DIAGNOSIS — Z7984 Long term (current) use of oral hypoglycemic drugs: Secondary | ICD-10-CM | POA: Diagnosis not present

## 2019-09-23 DIAGNOSIS — N179 Acute kidney failure, unspecified: Secondary | ICD-10-CM | POA: Diagnosis present

## 2019-09-23 DIAGNOSIS — G9341 Metabolic encephalopathy: Secondary | ICD-10-CM | POA: Diagnosis present

## 2019-09-23 DIAGNOSIS — N170 Acute kidney failure with tubular necrosis: Secondary | ICD-10-CM | POA: Diagnosis present

## 2019-09-23 DIAGNOSIS — I10 Essential (primary) hypertension: Secondary | ICD-10-CM | POA: Diagnosis present

## 2019-09-23 DIAGNOSIS — R0902 Hypoxemia: Secondary | ICD-10-CM

## 2019-09-23 LAB — BASIC METABOLIC PANEL
Anion gap: 12 (ref 5–15)
BUN: 106 mg/dL — ABNORMAL HIGH (ref 8–23)
CO2: 22 mmol/L (ref 22–32)
Calcium: 7.7 mg/dL — ABNORMAL LOW (ref 8.9–10.3)
Chloride: 128 mmol/L — ABNORMAL HIGH (ref 98–111)
Creatinine, Ser: 3.65 mg/dL — ABNORMAL HIGH (ref 0.61–1.24)
GFR calc Af Amer: 17 mL/min — ABNORMAL LOW (ref >60)
GFR calc non Af Amer: 14 mL/min — ABNORMAL LOW (ref >60)
Glucose, Bld: 206 mg/dL — ABNORMAL HIGH (ref 70–99)
Potassium: 3.4 mmol/L — ABNORMAL LOW (ref 3.5–5.1)
Sodium: 162 mmol/L (ref 135–145)

## 2019-09-23 LAB — URINALYSIS, COMPLETE (UACMP) WITH MICROSCOPIC
Bilirubin Urine: NEGATIVE
Glucose, UA: NEGATIVE mg/dL
Hgb urine dipstick: NEGATIVE
Ketones, ur: NEGATIVE mg/dL
Leukocytes,Ua: NEGATIVE
Nitrite: NEGATIVE
Protein, ur: 30 mg/dL — AB
Specific Gravity, Urine: 1.016 (ref 1.005–1.030)
pH: 5 (ref 5.0–8.0)

## 2019-09-23 LAB — CBC WITH DIFFERENTIAL/PLATELET
Abs Immature Granulocytes: 0.11 10*3/uL — ABNORMAL HIGH (ref 0.00–0.07)
Basophils Absolute: 0 10*3/uL (ref 0.0–0.1)
Basophils Relative: 0 %
Eosinophils Absolute: 0 10*3/uL (ref 0.0–0.5)
Eosinophils Relative: 0 %
HCT: 38.5 % — ABNORMAL LOW (ref 39.0–52.0)
Hemoglobin: 10.6 g/dL — ABNORMAL LOW (ref 13.0–17.0)
Immature Granulocytes: 1 %
Lymphocytes Relative: 11 %
Lymphs Abs: 1.2 10*3/uL (ref 0.7–4.0)
MCH: 26.2 pg (ref 26.0–34.0)
MCHC: 27.5 g/dL — ABNORMAL LOW (ref 30.0–36.0)
MCV: 95.1 fL (ref 80.0–100.0)
Monocytes Absolute: 0.7 10*3/uL (ref 0.1–1.0)
Monocytes Relative: 7 %
Neutro Abs: 8.6 10*3/uL — ABNORMAL HIGH (ref 1.7–7.7)
Neutrophils Relative %: 81 %
Platelets: 164 10*3/uL (ref 150–400)
RBC: 4.05 MIL/uL — ABNORMAL LOW (ref 4.22–5.81)
RDW: 19.9 % — ABNORMAL HIGH (ref 11.5–15.5)
WBC: 10.7 10*3/uL — ABNORMAL HIGH (ref 4.0–10.5)
nRBC: 0.2 % (ref 0.0–0.2)

## 2019-09-23 LAB — SARS CORONAVIRUS 2 BY RT PCR (HOSPITAL ORDER, PERFORMED IN ~~LOC~~ HOSPITAL LAB): SARS Coronavirus 2: NEGATIVE

## 2019-09-23 LAB — COMPREHENSIVE METABOLIC PANEL
ALT: 16 U/L (ref 0–44)
AST: 26 U/L (ref 15–41)
Albumin: 3 g/dL — ABNORMAL LOW (ref 3.5–5.0)
Alkaline Phosphatase: 201 U/L — ABNORMAL HIGH (ref 38–126)
Anion gap: 11 (ref 5–15)
BUN: 115 mg/dL — ABNORMAL HIGH (ref 8–23)
CO2: 24 mmol/L (ref 22–32)
Calcium: 8.3 mg/dL — ABNORMAL LOW (ref 8.9–10.3)
Chloride: 127 mmol/L — ABNORMAL HIGH (ref 98–111)
Creatinine, Ser: 3.72 mg/dL — ABNORMAL HIGH (ref 0.61–1.24)
GFR calc Af Amer: 16 mL/min — ABNORMAL LOW (ref >60)
GFR calc non Af Amer: 14 mL/min — ABNORMAL LOW (ref >60)
Glucose, Bld: 208 mg/dL — ABNORMAL HIGH (ref 70–99)
Potassium: 3.4 mmol/L — ABNORMAL LOW (ref 3.5–5.1)
Sodium: 162 mmol/L (ref 135–145)
Total Bilirubin: 1.1 mg/dL (ref 0.3–1.2)
Total Protein: 7.4 g/dL (ref 6.5–8.1)

## 2019-09-23 LAB — APTT: aPTT: 29 seconds (ref 24–36)

## 2019-09-23 LAB — TROPONIN I (HIGH SENSITIVITY)
Troponin I (High Sensitivity): 111 ng/L (ref <18)
Troponin I (High Sensitivity): 133 ng/L (ref <18)

## 2019-09-23 LAB — LACTIC ACID, PLASMA
Lactic Acid, Venous: 3.1 mmol/L (ref 0.5–1.9)
Lactic Acid, Venous: 2.9 mmol/L (ref 0.5–1.9)

## 2019-09-23 LAB — PROTIME-INR
INR: 1.3 — ABNORMAL HIGH (ref 0.8–1.2)
Prothrombin Time: 16 seconds — ABNORMAL HIGH (ref 11.4–15.2)

## 2019-09-23 LAB — FIBRIN DERIVATIVES D-DIMER (ARMC ONLY): Fibrin derivatives D-dimer (ARMC): 4580.41 ng/mL (FEU) — ABNORMAL HIGH (ref 0.00–499.00)

## 2019-09-23 MED ORDER — SODIUM CHLORIDE 0.9 % IV BOLUS
1000.0000 mL | Freq: Once | INTRAVENOUS | Status: AC
  Administered 2019-09-23: 1000 mL via INTRAVENOUS

## 2019-09-23 MED ORDER — MORPHINE SULFATE (PF) 2 MG/ML IV SOLN
2.0000 mg | Freq: Once | INTRAVENOUS | Status: AC
  Administered 2019-09-23: 17:00:00 2 mg via INTRAVENOUS

## 2019-09-23 MED ORDER — NOREPINEPHRINE 4 MG/250ML-% IV SOLN
0.0000 ug/min | INTRAVENOUS | Status: DC
  Administered 2019-09-23: 10 ug/min via INTRAVENOUS

## 2019-09-23 MED ORDER — LORAZEPAM 2 MG/ML IJ SOLN
1.0000 mg | Freq: Once | INTRAMUSCULAR | Status: AC
  Administered 2019-09-23: 1 mg via INTRAVENOUS
  Filled 2019-09-23: qty 1

## 2019-09-23 MED ORDER — NOREPINEPHRINE 4 MG/250ML-% IV SOLN
INTRAVENOUS | Status: AC
  Filled 2019-09-23: qty 250

## 2019-09-23 MED ORDER — MIDAZOLAM HCL 5 MG/5ML IJ SOLN
2.0000 mg | Freq: Once | INTRAMUSCULAR | Status: AC
  Administered 2019-09-23: 2 mg via INTRAVENOUS

## 2019-09-23 MED ORDER — MORPHINE SULFATE (PF) 2 MG/ML IV SOLN
INTRAVENOUS | Status: AC
  Administered 2019-09-23: 2 mg via INTRAVENOUS
  Filled 2019-09-23: qty 1

## 2019-09-23 MED ORDER — SODIUM CHLORIDE 0.9 % IV SOLN
1.0000 g | Freq: Once | INTRAVENOUS | Status: AC
  Administered 2019-09-23: 1 g via INTRAVENOUS
  Filled 2019-09-23: qty 1

## 2019-09-23 MED ORDER — SUCCINYLCHOLINE CHLORIDE 20 MG/ML IJ SOLN
100.0000 mg | Freq: Once | INTRAMUSCULAR | Status: AC
  Administered 2019-09-23: 100 mg via INTRAVENOUS

## 2019-09-24 LAB — URINE CULTURE: Culture: NO GROWTH

## 2019-09-27 NOTE — ED Notes (Signed)
Dr Cinda Quest preparing to intubate

## 2019-09-27 NOTE — ED Notes (Signed)
Malina MD and RT at bedside, patient extubated and disconnected from levophed

## 2019-09-27 NOTE — ED Notes (Signed)
Wife at bedside, spoke with Dr. Cinda Quest, wife wishes to d/c support at this time.  Wife to visit with pt momentarily and will notify nursing staff when she is ready.  Wife's questions answered and allowed time to process information.  Will stay close to family for support.

## 2019-09-27 NOTE — Death Summary Note (Signed)
[place] at [facility]  Death summary   PATIENT NAME: Stanley Zuniga    MR#:  [id number]  DATE OF BIRTH:  09-30-1933  DATE OF ADMISSION:  09-28-2019   ADMITTING PHYSICIAN: Otila Back, MD  DATE OF DISCHARGE: 28-Sep-2019 10:30 PM  PRIMARY CARE PHYSICIAN: [facility]   ADMISSION DIAGNOSIS:  Dehydration [E86.0] Hypernatremia [E87.0] Hypoxia [R09.02] Acute respiratory failure with hypoxia (HCC) [J96.01] Elevated d-dimer [R79.89] Hypotension, unspecified hypotension type [I95.9] Acute kidney injury (AKI) with acute tubular necrosis (ATN) ([place]) [N17.0] DISCHARGE DIAGNOSIS:  Active Problems:   AKI (acute kidney injury) ([place])  SECONDARY DIAGNOSIS:   Past Medical History:  Diagnosis Date  . Cerebral infarction ([place])   . Colon cancer ([place])   . Confusion    started on donepezil recently  . Coronary artery disease   . Depression   . Diabetes mellitus without complication ([place])   . Diverticulosis   . Gout   . HOH (hard of hearing)    has aid for left ear - does not wear  . Hypercholesteremia   . Hypertension   . Neuropathy    legs due to DM  . No natural teeth   . Shortness of breath dyspnea   . Sleep apnea    CPAP   HOSPITAL COURSE:  Chief complaint; unresponsiveness  History of presenting complaint; Stanley Zuniga  is a 85 y.o. male with a known history of diabetes mellitus, hypertension, colon cancer, hyperlipidemia, obstructive sleep apnea and prior history of CVA who was brought in from [place] facility due to decreased level of responsiveness.  Patient's wife reported patient has had decreased p.o. intake lately.  Noted to be confused and less responsive today with low blood pressure and was sent to the emergency room.  Patient was reported to have been hypoxic in the emergency room and was immediately intubated by emergency room provider.  Was given IV fluids and subsequently started on Levophed drip.   After patient's wife arrived in the emergency room she told emergency room physician that patient was DNR/DNI and she wants patient kept comfortable.  She requested to have patient extubated.  Patient was extubated and Levophed drip discontinued.  She wished to keep patient comfortable going forward with no aggressive care. At the time of my evaluation patient was resting comfortably in bed but with decreased responsiveness.  Currently on oxygen via nasal cannula.  Systolic blood pressure in the 90s.  Patient wife confirms she does not want IV fluids.  No antibiotics.  No more lab draws.  She was agreeable to get a palliative care consult with goal of keeping patient on comfort care measures only going forward.   Hospital course; 1.  Acute kidney injury Secondary to decreased p.o. intake. Wife has decided on keeping patient on comfort care measures only going forward.  She does not want any IV fluids.  No blood draws. Patient was resting comfortably. Palliative care consult placed.  A few hours after admission patient was pronounced dead by nursing staff at 9:25 PM.  Wife was present at bedside. 2.  Acute hypoxic respiratory failure Patient was hypoxic and intubated on arrival in the emergency room. Extubated by ED physician in accordance with patient's wife wishes prior to my evaluation Wife wanted patient to be on comfort care going forward.  Patient was later pronounced dead a few hours after admission 3.  Acute metabolic encephalopathy Patient with decreased level of responsiveness No aggressive measures.  Patient  was kept comfortable in accordance with wife's wishes. 4.  Hyponatremia with sodium of 162 Secondary to dehydration Discussed IV fluid hydration with wife but she declined and wanted patient kept comfortable going forward. A few hours after admission patient was pronounced dead by nursing staff at 9:25 PM.  Wife was present at bedside     DATA REVIEW:   CBC Recent Labs   Lab 2019-10-03 1351  WBC 10.7*  HGB 10.6*  HCT 38.5*  PLT 164    Chemistries  Recent Labs  Lab 10-03-2019 1351 2019/10/03 1453  NA 162* 162*  K 3.4* 3.4*  CL 127* 128*  CO2 24 22  GLUCOSE 208* 206*  BUN 115* 106*  CREATININE 3.72* 3.65*  CALCIUM 8.3* 7.7*  AST 26  --   ALT 16  --   ALKPHOS 201*  --   BILITOT 1.1  --      Microbiology Results    RADIOLOGY:  No results found.   Management plans discussed with the patient, family and they are in agreement.  CODE STATUS: Prior   TOTAL TIME TAKING CARE OF THIS PATIENT: 37 minutes.    Tenise Stetler M.D on 09/25/2019 at 10:19 AM  Between 7am to 6pm - Pager - [phone]  After 6pm go to [email or web address] - Proofreader  [facility]  Office  [phone]  CC: Primary care physician; [place]   Note: This dictation was prepared with Dragon dictation along with smaller phrase technology. Any transcriptional errors that result from this process are unintentional.

## 2019-09-27 NOTE — ED Provider Notes (Addendum)
[facility] Emergency Department Provider Note   ____________________________________________   First MD Initiated Contact with Patient 2019-10-11 1338     (approximate)  I have reviewed the triage vital signs and the nursing notes.   HISTORY  Chief Complaint unresponsive  Chief complaint is reportedly failure to thrive history limited by altered mental status  HPI Stanley Zuniga is a 85 y.o. male patient from [place] [facility] not doing well last couple days today unresponsive currently not eating or drinking low oxygen sats low blood pressure given oxygen and fluids his blood pressure came up somewhat but here in the emergency room initially was 70 and now dropped down to 60 systolic.  Patient is having retractions.  He is unresponsive although he is moving both of his arms.  Does not appear to be having any chest pain or belly pain with palpation he has no edema in his legs no JVD.  Cannot get any further history         Past Medical History:  Diagnosis Date   Cerebral infarction ([place])    Colon cancer ([place])    Confusion    started on donepezil recently   Coronary artery disease    Depression    Diabetes mellitus without complication ([place])    Diverticulosis    Gout    HOH (hard of hearing)    has aid for left ear - does not wear   Hypercholesteremia    Hypertension    Neuropathy    legs due to DM   No natural teeth    Shortness of breath dyspnea    Sleep apnea    CPAP    There are no active problems to display for this patient.   Past Surgical History:  Procedure Laterality Date   CARDIAC CATHETERIZATION     stents - over 5 yrs ago   COLON RESECTION     COLONOSCOPY     ENTROPIAN REPAIR Left 08/10/2016   Procedure: REPAIR OF ENTROPION/ LOWER LID;  Surgeon: Karle Starch, MD;  Location: [place];  Service: Ophthalmology;  Laterality: Left;  Diabetic - oral meds Sleep apnea   PTOSIS REPAIR Left  08/10/2016   Procedure: PTOSIS REPAIR/ UPPER LID;  Surgeon: Karle Starch, MD;  Location: [place];  Service: Ophthalmology;  Laterality: Left;    Prior to Admission medications   Medication Sig Start Date End Date Taking? Authorizing Provider  acetaminophen (TYLENOL) 500 MG tablet Take 500 mg by mouth every 4 (four) hours as needed.    Yes [provider]  allopurinol (ZYLOPRIM) 100 MG tablet Take 200 mg by mouth daily.    Yes [provider]  aluminum-magnesium hydroxide-simethicone (MAALOX) 200-200-20 MG/5ML SUSP Take 30 mLs by mouth every 6 (six) hours as needed.   Yes [provider]  aspirin 81 MG tablet Take 81 mg by mouth daily.   Yes [provider]  Cholecalciferol (VITAMIN D) 2000 units CAPS Take 1 capsule by mouth daily.    Yes [provider]  guaiFENesin (ROBITUSSIN) 100 MG/5ML SOLN Take 5 mLs by mouth every 6 (six) hours as needed for cough or to loosen phlegm.   Yes [provider]  lipase/protease/amylase (CREON) 36000 UNITS CPEP capsule Take 36,000 Units by mouth 2 (two) times a day.   Yes [provider]  loperamide (IMODIUM A-D) 2 MG tablet Take 2 mg by mouth daily. And as needed   Yes [provider]  losartan (COZAAR) 50 MG tablet Take 50 mg by mouth daily.   Yes [provider]  magnesium hydroxide (MILK OF MAGNESIA) 400 MG/5ML suspension Take 30 mLs by mouth at bedtime as needed for mild constipation.   Yes [provider]  metFORMIN (GLUCOPHAGE-XR) 500 MG 24 hr tablet Take 500 mg by mouth daily with breakfast.   Yes [provider]  metoprolol tartrate (LOPRESSOR) 25 MG tablet Take 25 mg by mouth 2 (two) times daily.   Yes [provider]  nystatin (NYSTATIN) powder Apply topically 3 (three) times daily.   Yes [provider]  sertraline (ZOLOFT) 50 MG tablet Take 50 mg by mouth daily.   Yes [provider]  tamsulosin (FLOMAX) 0.4 MG CAPS  capsule Take 0.4 mg by mouth daily.    Yes [provider]  zinc oxide 20 % ointment See admin instructions. Apply ointment to left and right buttocks every other day, twice daily on the days pt is getting the med   Yes [provider]    Allergies Codeine  History reviewed. No pertinent family history.  Social History Social History   Tobacco Use   Smoking status: Never Smoker   Smokeless tobacco: Never Used  Substance Use Topics   Alcohol use: No   Drug use: Not Currently    Review of Systems Unable to obtain ____________________________________________   PHYSICAL EXAM:  VITAL SIGNS: ED Triage Vitals  Enc Vitals Group     BP      Pulse      Resp      Temp      Temp src      SpO2      Weight      Height      Head Circumference      Peak Flow      Pain Score      Pain Loc      Pain Edu?      Excl. in [place]?     Constitutional: Unresponsive Eyes: Patient missing his left eye right eyes staring o and reactive pupil he is Head: Atraumatic. Nose: No congestion/rhinnorhea. Mouth/Throat: Mucous membranes are moist.  Oropharynx non-erythematous. Neck: No stridor.  Cardiovascular: Normal rate, regular rhythm. Grossly normal heart sounds.  Good peripheral circulation. Respiratory: Normal respiratory effort.  No retractions. Lungs CTAB. Gastrointestinal: Soft and nontender. No distention. No abdominal bruits. No CVA tenderness. Musculoskeletal: No lower extremity tenderness nor edema.   Neurologic: Unresponsive but moving both arms spontaneously and equally Skin:  Skin is warm, dry and intact. No rash noted.   ____________________________________________   LABS (all labs ordered are listed, but only abnormal results are displayed)  Labs Reviewed  COMPREHENSIVE METABOLIC PANEL - Abnormal; Notable for the following components:      Result Value   Sodium 162 (*)    Potassium 3.4 (*)    Chloride 127 (*)    Glucose, Bld 208 (*)    BUN 115  (*)    Creatinine, Ser 3.72 (*)    Calcium 8.3 (*)    Albumin 3.0 (*)    Alkaline Phosphatase 201 (*)    GFR calc non Af Amer 14 (*)    GFR calc Af Amer 16 (*)    All other components within normal limits  LACTIC ACID, PLASMA - Abnormal; Notable for the following components:   Lactic Acid, Venous 3.1 (*)    All other components within normal limits  CBC WITH DIFFERENTIAL/PLATELET - Abnormal; Notable for  the following components:   WBC 10.7 (*)    RBC 4.05 (*)    Hemoglobin 10.6 (*)    HCT 38.5 (*)    MCHC 27.5 (*)    RDW 19.9 (*)    Neutro Abs 8.6 (*)    Abs Immature Granulocytes 0.11 (*)    All other components within normal limits  FIBRIN DERIVATIVES D-DIMER ([facility] ONLY) - Abnormal; Notable for the following components:   Fibrin derivatives D-dimer ([facility]) 4,580.41 (*)    All other components within normal limits  URINALYSIS, COMPLETE (UACMP) WITH MICROSCOPIC - Abnormal; Notable for the following components:   Color, Urine AMBER (*)    APPearance TURBID (*)    Protein, ur 30 (*)    Bacteria, UA FEW (*)    All other components within normal limits  PROTIME-INR - Abnormal; Notable for the following components:   Prothrombin Time 16.0 (*)    INR 1.3 (*)    All other components within normal limits  BASIC METABOLIC PANEL - Abnormal; Notable for the following components:   Sodium 162 (*)    Potassium 3.4 (*)    Chloride 128 (*)    Glucose, Bld 206 (*)    BUN 106 (*)    Creatinine, Ser 3.65 (*)    Calcium 7.7 (*)    GFR calc non Af Amer 14 (*)    GFR calc Af Amer 17 (*)    All other components within normal limits  TROPONIN I (HIGH SENSITIVITY) - Abnormal; Notable for the following components:   Troponin I (High Sensitivity) 111 (*)    All other components within normal limits  CULTURE, BLOOD (ROUTINE X 2)  CULTURE, BLOOD (ROUTINE X 2)  URINE CULTURE  SARS CORONAVIRUS 2 (HOSPITAL ORDER, [facility])  APTT  BRAIN NATRIURETIC PEPTIDE  LACTIC  ACID, PLASMA  TROPONIN I (HIGH SENSITIVITY)   ____________________________________________  EKG  EMS EKG shows sinus tachycardia rate of 122 normal axis right bundle branch block he has an [id number] for amplitude in the V leads _EKG in the emergency room #1 shows sinus tachycardia rate of 120 slight rightward axis right bundle branch block computer is also reading left posterior hemiblock some premature beats are present.  These are supraventricular premature beats.  EKG #3 shows sinus tach with multiple premature beats at a rate of 110 right bundle branch block no acute changes EKG #4 shows what appears to be sinus tach with multiple premature beats right bundle branch block ___________________________________________  RADIOLOGY  ED MD interpretation  Official radiology report(s): Dg Chest Portable 1 View  Result Date: 08-Oct-2019 CLINICAL DATA:  Repositioning of endotracheal tube EXAM: PORTABLE CHEST 1 VIEW COMPARISON:  Oct 08, 2019 FINDINGS: Endotracheal tube tip is 4.7 cm superior to the carina. Esophageal tube tip in the left upper quadrant over the proximal stomach. Persistent low lung volumes with subsegmental atelectasis at the bases. Nodular opacity in the left upper lobe. No consolidation or effusion. Stable cardiomediastinal silhouette with aortic atherosclerosis. No pneumothorax. Probable chronic bilateral rib fractures. Lucency in the epigastric region presumably air distended bowel. Possible calcifications in the right upper quadrant. IMPRESSION: 1. Endotracheal tube tip 4.7 cm superior to carina. Esophageal tube tip overlies the proximal stomach 2. Mildly low lung volumes with subsegmental atelectasis at the bases. Possible small lung nodule in the left upper lobe. Evaluation with CT could be obtained. 3. Suspected right upper quadrant calcifications, possible gallstones or kidney stones. 4. Generalized lucency over the epigastric region presumably  represents air distended bowel. Dedicated  abdominal radiograph may be obtained as indicated. Electronically Signed   By: Donavan Foil M.D.   On: 2019/10/10 15:16   Dg Chest Portable 1 View  Result Date: 10-10-2019 CLINICAL DATA:  Low oxygen saturation EXAM: PORTABLE CHEST 1 VIEW COMPARISON:  06/28/2007 FINDINGS: Endotracheal tube tip is about 5.3 cm superior to the carina. Esophageal tube tip is below the diaphragm, and is obscured by overlying pacer patch. Low lung volumes. Subsegmental atelectasis at the bases. Stable cardiomediastinal silhouette. No pneumothorax. Age indeterminate left sixth rib fracture and right fifth and sixth rib fractures. Catheter tubing looped over the supraclavicular fossa bilaterally. IMPRESSION: 1. Endotracheal tube tip about 5.3 cm superior to carina. Esophageal tube tip below the diaphragm but obscured by overlying pacing device 2. Low lung volumes.  Subsegmental atelectasis at the bases. 3. Age indeterminate bilateral rib fractures. Electronically Signed   By: Donavan Foil M.D.   On: 10-10-2019 15:13    ____________________________________________   PROCEDURES  Procedure(s) performed (including Critical Care): Patient's O2 sats are dropping even with bag-valve-mask are only in the 80s.  It is difficult to bag valve mask ventilate this gentleman because his cheeks are so sunken and I cannot get a good seal.  I have been told that the wife called and spoke to a nurse who then spoke to another nurse who spoke to [facility] who told me that he is to be a no code however have no paperwork from this on the nursing home reports he was a full code the patient is not breathing well his sats are dropping even with high flow nasal cannula.  His pressure is dropping as well even in spite of more fluids.  We are starting him on Levophed and then I sedated him with 2 mg of Versed even now he is really unresponsive and 100 of sucs.  I intubated him with #7 ET tube under direct vision.  The tube is visualized going through the  cord he has good bilateral breath sounds and good color change on the colorimetric monitor.  Chest x-ray was done shows the ET tube in position although slightly high.  It will be advanced and we will repeat the EKG.  I am treating this patient as a septic patient with antibiotics etc.  If and when his wife is able to get here and does not want him intubated we can always extubate this gentleman. Patient later extubated when family arrives.  Critical care time for this patient 45 minutes including the above note discussing him with his family.  Procedures   ____________________________________________   INITIAL IMPRESSION / ASSESSMENT AND PLAN / ED COURSE      ----------------------------------------- 2:48 PM on 10/10/19 -----------------------------------------  Labs come back with a sodium of 162 creatinine 3.7 GFR of 14 troponin of 111 lactic acid 3.1 and a d-dimer 4580.  His INR is only 1.3 .  His sodium is after 2 L of normal saline.  We will repeat the met be and give him another liter in the meantime.  We will watch his pulse ox closely.  His wife is reportedly coming.    Patient's renal function shows a GFR of 14.  We are going to be unable to CT angios this gentleman.    Patient's wife has arrived.  She makes him no code.  We will extubate him and stop the Levophed as she says he did not want these kind of treatments.  We are waiting just  a little bit until 1 further family member arrives.   ----------------------------------------- 5:07 PM on Oct 16, 2019 -----------------------------------------  Patient's minister who is a former [employer] scientist is here.  We discussed everything with wife again we will extubate the patient stop levo fed use IV fluids and nasal cannula oxygen to help keep him comfortable.  I also gave him 2 mg of morphine IV.  ____________________________________________   FINAL CLINICAL IMPRESSION(S) / ED DIAGNOSES  Final diagnoses:  Hypernatremia  Dehydration    Elevated d-dimer  Hypotension, unspecified hypotension type  Hypoxia  Acute kidney injury (AKI) with acute tubular necrosis (ATN) (HCC)  Acute respiratory failure with hypoxia [facility])     ED Discharge Orders    None       Note:  This document was prepared using Dragon voice recognition software and may include unintentional dictation errors.    Nena Polio, MD 10-16-19 1708    Nena Polio, MD 10/04/19 [phone]

## 2019-09-27 NOTE — H&P (Signed)
[facility] [place] NAME: Stanley Zuniga    MR#:  [id number]  DATE OF BIRTH:  Mar 18, 1933  DATE OF ADMISSION:  10/15/2019  PRIMARY CARE PHYSICIAN: [facility]   REQUESTING/REFERRING PHYSICIAN: Conni Slipper  CHIEF COMPLAINT:   Chief Complaint  Patient presents with   unresponsive    HISTORY OF PRESENT ILLNESS:  Stanley Zuniga  is a 85 y.o. male with a known history of diabetes mellitus, hypertension, colon cancer, hyperlipidemia, obstructive sleep apnea and prior history of CVA who was brought in from [facility] facility due to decreased level of responsiveness.  Patient's wife reported patient has had decreased p.o. intake lately.  Noted to be confused and less responsive today with low blood pressure and was sent to the emergency room.  Patient was reported to have been hypoxic in the emergency room and was immediately intubated by emergency room provider.  Was given IV fluids and subsequently started on Levophed drip.  After patient's wife arrived in the emergency room she told emergency room physician that patient was DNR/DNI and she wants patient kept comfortable.  She requested to have patient extubated.  Patient was extubated and Levophed drip discontinued.  She wishes to keep patient comfortable going forward with no aggressive care. At the time of my evaluation patient was resting comfortably in bed but with decreased responsiveness.  Currently on oxygen via nasal cannula.  Systolic blood pressure in the 90s.  Patient wife confirms she does not want IV fluids.  No antibiotics.  No more lab draws.  She is agreeable to get a palliative care consult with goal of keeping patient on comfort care measures only going forward.  PAST MEDICAL HISTORY:   Past Medical History:  Diagnosis Date   Cerebral infarction [facility])    Colon cancer ([place])    Confusion    started on donepezil recently   Coronary artery  disease    Depression    Diabetes mellitus without complication ([place])    Diverticulosis    Gout    HOH (hard of hearing)    has aid for left ear - does not wear   Hypercholesteremia    Hypertension    Neuropathy    legs due to DM   No natural teeth    Shortness of breath dyspnea    Sleep apnea    CPAP    PAST SURGICAL HISTORY:   Past Surgical History:  Procedure Laterality Date   CARDIAC CATHETERIZATION     stents - over 5 yrs ago   COLON RESECTION     COLONOSCOPY     ENTROPIAN REPAIR Left 08/10/2016   Procedure: REPAIR OF ENTROPION/ LOWER LID;  Surgeon: Karle Starch, MD;  Location: [place];  Service: Ophthalmology;  Laterality: Left;  Diabetic - oral meds Sleep apnea   PTOSIS REPAIR Left 08/10/2016   Procedure: PTOSIS REPAIR/ UPPER LID;  Surgeon: Karle Starch, MD;  Location: [place];  Service: Ophthalmology;  Laterality: Left;    SOCIAL HISTORY:   Social History   Tobacco Use   Smoking status: Never Smoker   Smokeless tobacco: Never Used  Substance Use Topics   Alcohol use: No    FAMILY HISTORY:  History reviewed. No pertinent family history.  DRUG ALLERGIES:   Allergies  Allergen Reactions   Codeine     hallucinations    REVIEW OF SYSTEMS:   ROS unobtainable due to patient's clinical condition  MEDICATIONS AT HOME:   Prior to Admission medications   Medication Sig Start Date End Date Taking? Authorizing Provider  acetaminophen (TYLENOL) 500 MG tablet Take 500 mg by mouth every 4 (four) hours as needed.    Yes [provider]  allopurinol (ZYLOPRIM) 100 MG tablet Take 200 mg by mouth daily.    Yes [provider]  aluminum-magnesium hydroxide-simethicone (MAALOX) 200-200-20 MG/5ML SUSP Take 30 mLs by mouth every 6 (six) hours as needed.   Yes [provider]  aspirin 81 MG tablet Take 81 mg by mouth daily.   Yes [provider]  Cholecalciferol (VITAMIN D) 2000 units CAPS  Take 1 capsule by mouth daily.    Yes [provider]  guaiFENesin (ROBITUSSIN) 100 MG/5ML SOLN Take 5 mLs by mouth every 6 (six) hours as needed for cough or to loosen phlegm.   Yes [provider]  lipase/protease/amylase (CREON) 36000 UNITS CPEP capsule Take 36,000 Units by mouth 2 (two) times a day.   Yes [provider]  loperamide (IMODIUM A-D) 2 MG tablet Take 2 mg by mouth daily. And as needed   Yes [provider]  losartan (COZAAR) 50 MG tablet Take 50 mg by mouth daily.   Yes [provider]  magnesium hydroxide (MILK OF MAGNESIA) 400 MG/5ML suspension Take 30 mLs by mouth at bedtime as needed for mild constipation.   Yes [provider]  metFORMIN (GLUCOPHAGE-XR) 500 MG 24 hr tablet Take 500 mg by mouth daily with breakfast.   Yes [provider]  metoprolol tartrate (LOPRESSOR) 25 MG tablet Take 25 mg by mouth 2 (two) times daily.   Yes [provider]  nystatin (NYSTATIN) powder Apply topically 3 (three) times daily.   Yes [provider]  sertraline (ZOLOFT) 50 MG tablet Take 50 mg by mouth daily.   Yes [provider]  tamsulosin (FLOMAX) 0.4 MG CAPS capsule Take 0.4 mg by mouth daily.    Yes [provider]  zinc oxide 20 % ointment See admin instructions. Apply ointment to left and right buttocks every other day, twice daily on the days pt is getting the med   Yes [provider]      VITAL SIGNS:  Blood pressure (!) 101/52, pulse 88, temperature 97.7 F (36.5 C), resp. rate 16, SpO2 100 %.  PHYSICAL EXAMINATION:  Physical Exam  GENERAL:  85 y.o.-year-old patient lying in the bed with no acute distress.  Currently on oxygen via nasal cannula. EYES: Pupils equal, round, reactive to light and accommodation. No scleral icterus.  HEENT: Head atraumatic, normocephalic. Oropharynx and nasopharynx clear.  NECK:  Supple, no jugular venous distention. No thyroid enlargement,  no tenderness.  LUNGS: Normal breath sounds bilaterally, no wheezing, rales,rhonchi or crepitation. No use of accessory muscles of respiration.  CARDIOVASCULAR: S1, S2 normal. No murmurs, rubs, or gallops.  ABDOMEN: Soft, nondistended. Bowel sounds present. No organomegaly or mass.  EXTREMITIES: No pedal edema, cyanosis, or clubbing.  NEUROLOGIC: Patient with decreased level of responsiveness.  Not following commands.  Full neuro exam not done Gait not checked.  PSYCHIATRIC: LETHARGIC  SKIN: No obvious rash, lesion,   LABORATORY PANEL:   CBC Recent Labs  Lab 10-11-2019 1351  WBC 10.7*  HGB 10.6*  HCT 38.5*  PLT 164   ------------------------------------------------------------------------------------------------------------------  Chemistries  Recent Labs  Lab 11-Oct-2019 1351 10-11-2019 1453  NA 162* 162*  K 3.4* 3.4*  CL 127* 128*  CO2 24 22  GLUCOSE 208* 206*  BUN 115* 106*  CREATININE 3.72* 3.65*  CALCIUM 8.3* 7.7*  AST 26  --   ALT 16  --   ALKPHOS 201*  --   BILITOT 1.1  --    ------------------------------------------------------------------------------------------------------------------  Cardiac Enzymes No results for input(s): TROPONINI in the last 168 hours. ------------------------------------------------------------------------------------------------------------------  RADIOLOGY:  Dg Chest Portable 1 View  Result Date: 10-Oct-2019 CLINICAL DATA:  Repositioning of endotracheal tube EXAM: PORTABLE CHEST 1 VIEW COMPARISON:  2019/10/10 FINDINGS: Endotracheal tube tip is 4.7 cm superior to the carina. Esophageal tube tip in the left upper quadrant over the proximal stomach. Persistent low lung volumes with subsegmental atelectasis at the bases. Nodular opacity in the left upper lobe. No consolidation or effusion. Stable cardiomediastinal silhouette with aortic atherosclerosis. No pneumothorax. Probable chronic bilateral rib fractures. Lucency in the epigastric region  presumably air distended bowel. Possible calcifications in the right upper quadrant. IMPRESSION: 1. Endotracheal tube tip 4.7 cm superior to carina. Esophageal tube tip overlies the proximal stomach 2. Mildly low lung volumes with subsegmental atelectasis at the bases. Possible small lung nodule in the left upper lobe. Evaluation with CT could be obtained. 3. Suspected right upper quadrant calcifications, possible gallstones or kidney stones. 4. Generalized lucency over the epigastric region presumably represents air distended bowel. Dedicated abdominal radiograph may be obtained as indicated. Electronically Signed   By: Donavan Foil M.D.   On: October 10, 2019 15:16   Dg Chest Portable 1 View  Result Date: October 10, 2019 CLINICAL DATA:  Low oxygen saturation EXAM: PORTABLE CHEST 1 VIEW COMPARISON:  06/28/2007 FINDINGS: Endotracheal tube tip is about 5.3 cm superior to the carina. Esophageal tube tip is below the diaphragm, and is obscured by overlying pacer patch. Low lung volumes. Subsegmental atelectasis at the bases. Stable cardiomediastinal silhouette. No pneumothorax. Age indeterminate left sixth rib fracture and right fifth and sixth rib fractures. Catheter tubing looped over the supraclavicular fossa bilaterally. IMPRESSION: 1. Endotracheal tube tip about 5.3 cm superior to carina. Esophageal tube tip below the diaphragm but obscured by overlying pacing device 2. Low lung volumes.  Subsegmental atelectasis at the bases. 3. Age indeterminate bilateral rib fractures. Electronically Signed   By: Donavan Foil M.D.   On: 10/10/2019 15:13      IMPRESSION AND PLAN:  Patient is an 85 year old male with history of diabetes mellitus, hypertension, colon cancer, hyperlipidemia, obstructive sleep apnea and prior history of CVA who was brought in from [facility] facility due to decreased level of responsiveness.  1.  Acute kidney injury Secondary to decreased p.o. intake. Wife has decided on  keeping patient on comfort care measures only going forward.  She does not want any IV fluids.  No blood draws Patient currently resting comfortably Palliative care consult placed.  2.  Acute hypoxic respiratory failure Patient was hypoxic and intubated on arrival in the emergency room. Extubated by ED physician in accordance with patient's wife wishes prior to my evaluation Wife wants patients to be on comfort care going forward.  3.  Acute metabolic encephalopathy Patient with decreased level of responsiveness No aggressive measures.  Plans for comfort care going forward in accordance with wife's wishes  4.  Hyponatremia with sodium of 162 Secondary to dehydration Discussed IV fluid hydration with wife but she declined and wants patient kept comfortable going forward.  DVT prophylaxis; SCDs No heparin products due to wife's wishes of keeping patient comfortable going forward.    All the records are reviewed and case discussed with ED provider. Management plans discussed  with the patient, family and they are in agreement. Patient's wife at bedside Mrs. Stanley Zuniga confirmed patient's CODE STATUS to be DNR/DNI going forward with focus on keeping patient comfortable.  CODE STATUS: DNR/DNI  TOTAL TIME TAKING CARE OF THIS PATIENT: 60 minutes.    Idamae Coccia M.D on 10/22/2019 at 6:08 PM  Between 7am to 6pm - Pager - [phone]  After 6pm go to [email or web address] - Proofreader  [facility]  Office  [phone]  CC: Primary care physician; [place]   Note: This dictation was prepared with Dragon dictation along with smaller phrase technology. Any transcriptional errors that result from this process are unintentional.

## 2019-09-27 NOTE — Progress Notes (Signed)
Notified by family that pt not breathing, upon entering room pt without respirations and pulse, verified by Candace McFail RN, wife at bedside, Dr Anselm Jungling aware

## 2019-09-27 NOTE — ED Notes (Signed)
Wife remains at bedside.  Family is waiting for one more family member to arrive prior to extubation.  Will maintain pt status at this time.

## 2019-09-27 NOTE — Progress Notes (Signed)
Care Alignment Note  Advanced Directives Documents (Living Will, Power of Attorney) currently in the EHR no advanced directives documents available .  Has the patient discussed their wishes with their family/healthcare power of attorney yes. How much does the family or healthcare power of attorney know about their wishes. Patient's wife at bedside decided to make patient DNR/DNI with focus on keeping patient comfortable going forward.  She requested to have patient extubated in the emergency room.  She does not want any pressors.  No IV fluids.  No more lab draws.  She requested for palliative care consult with focus on keeping patient comfortable going forward.  What does the patient/decision maker understand about their medical condition and the natural course of their disease.  Acute kidney injury.  Hyponatremia.  Dehydration.  Acute hepatic encephalopathy.  What is the patient/decision maker's biggest fear or concern for the future becoming a burden to my family  What is the most important goal for this patient should their health condition worsen care focused on comfort .  Current   Code Status: DNR  Current code status has been reviewed/updated.  Time spent:18 minutes

## 2019-09-27 NOTE — Progress Notes (Signed)
   Oct 20, 2019 1539  Clinical Encounter Type  Visited With Patient and family together  Visit Type Initial  Referral From Nurse  Consult/Referral To Chaplain  Spiritual Encounters  Spiritual Needs Prayer;Emotional;Grief support  [facility] received page at 1539 of patient who came in from SNF and was intubated in room 18 of ED. Patient's sister-in-law was in hallway. [place] introduced self and entered room where patient's wife of 50 years was bedside. Wife was grieving appropriately. Wife feels husband will pass soon and receives comfort knowing that husband was a devout Christian who was actively involved in church. [place] provided pastoral care through the ministry of presence. Normalized feelings and emotions. Participated in life review. Visit was filled with tears, laughter and expressed faith in God and hope of a reunion in heaven. [place] prayed with patient and family upon request. Follow up visits will be appreciated.

## 2019-09-27 NOTE — ED Triage Notes (Signed)
Pt arrived unresponsive agonal type breathing with retractions and hypotensive. From [facility]. Per EMS pt deteriorated starting yesterday.

## 2019-09-27 NOTE — ED Notes (Signed)
This Rn transported pt to room 110. Family at bedside at this time.

## 2019-09-27 NOTE — Progress Notes (Signed)
CODE SEPSIS - PHARMACY COMMUNICATION  **Broad Spectrum Antibiotics should be administered within 1 hour of Sepsis diagnosis**  Time Code Sepsis Called/Page Received: 1350  Antibiotics Ordered: Cefepime 1 g x 1 dose in the ED.  Time of 1st antibiotic administration: [place], PharmD Pharmacy Resident  2019-10-05 2:45 PM

## 2019-09-27 DEATH — deceased

## 2019-09-28 LAB — CULTURE, BLOOD (ROUTINE X 2): Culture: NO GROWTH

## 2021-05-04 IMAGING — DX DG CHEST 1V PORT
1 series · 1 of 1 positions shown · non-contrast
Comparison: 09/23/2019

CLINICAL DATA: Repositioning of endotracheal tube

EXAM:
PORTABLE CHEST 1 VIEW

[chest ap]
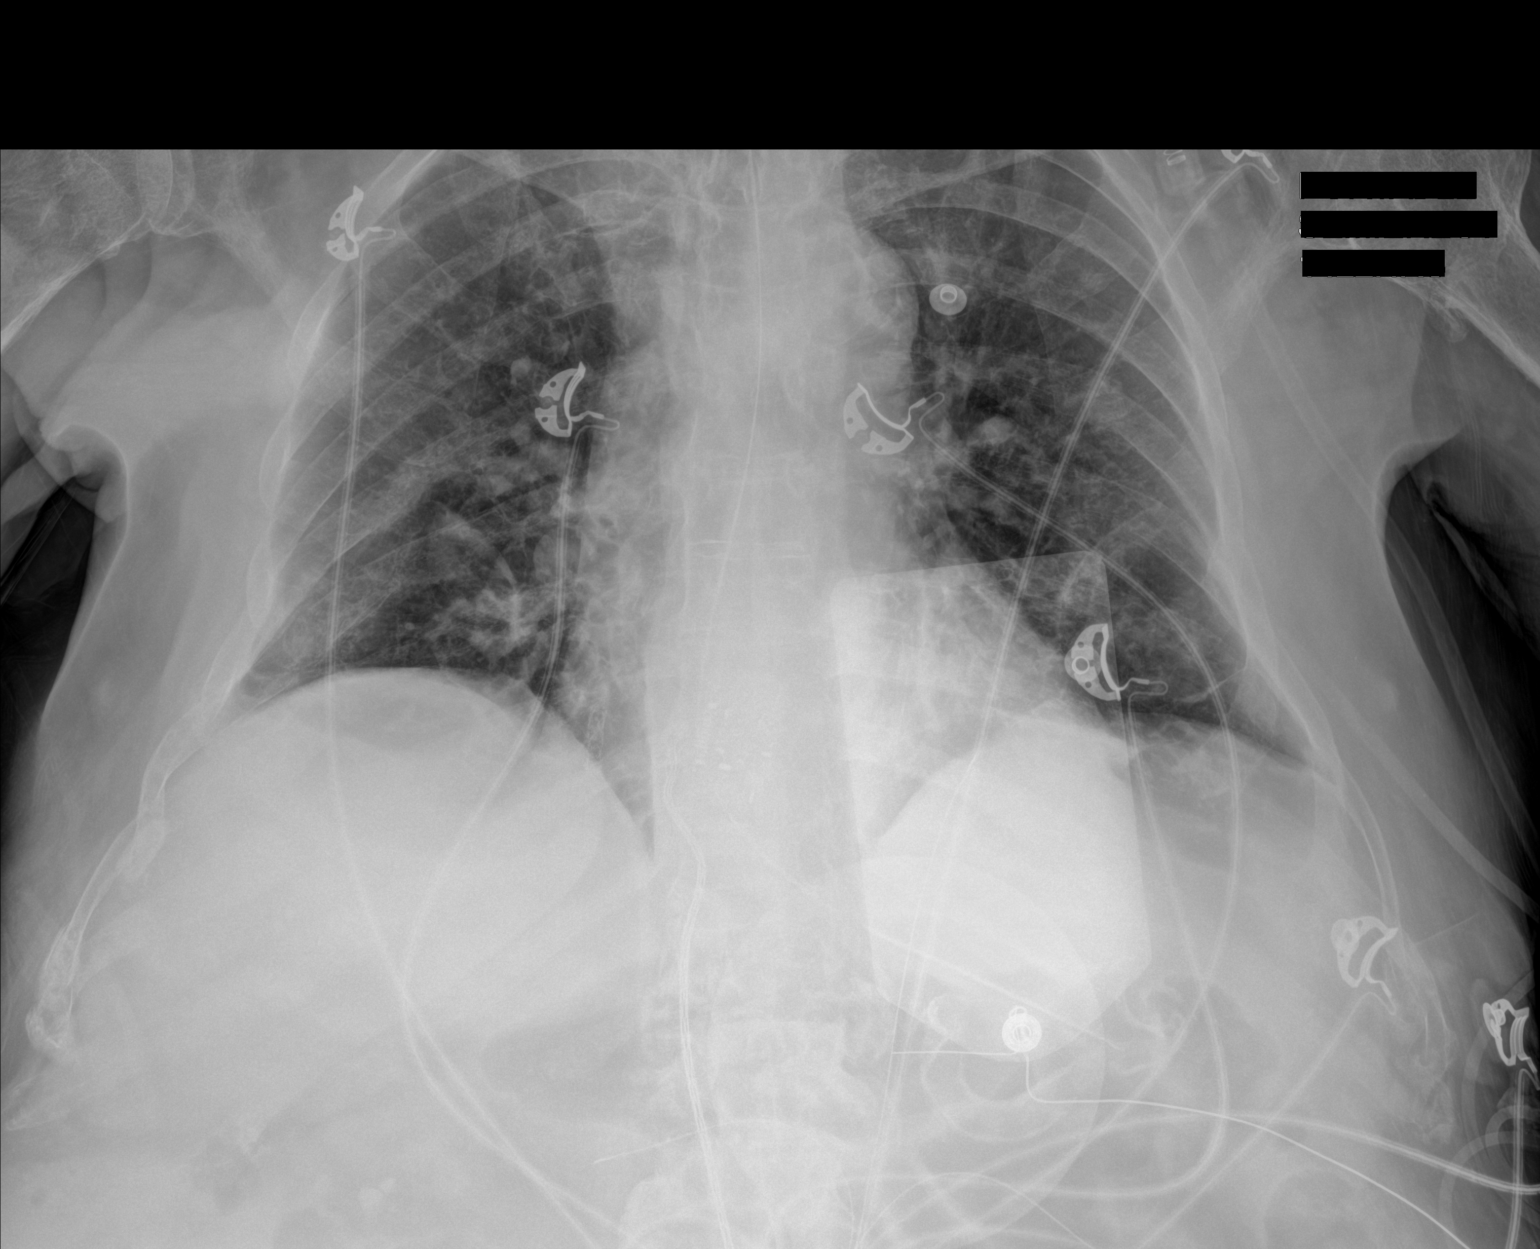

[1 of 1 positions shown; findings below may reference images not displayed]

FINDINGS: Endotracheal tube tip is 4.7 cm superior to the carina. Esophageal
tube tip in the left upper quadrant over the proximal stomach.
Persistent low lung volumes with subsegmental atelectasis at the
bases. Nodular opacity in the left upper lobe. No consolidation or
effusion. Stable cardiomediastinal silhouette with aortic
atherosclerosis. No pneumothorax. Probable chronic bilateral rib
fractures. Lucency in the epigastric region presumably air distended
bowel. Possible calcifications in the right upper quadrant.
IMPRESSION: 1. Endotracheal tube tip 4.7 cm superior to carina. Esophageal tube
tip overlies the proximal stomach
2. Mildly low lung volumes with subsegmental atelectasis at the
bases. Possible small lung nodule in the left upper lobe. Evaluation
with CT could be obtained.
3. Suspected right upper quadrant calcifications, possible
gallstones or kidney stones.
4. Generalized lucency over the epigastric region presumably
represents air distended bowel. Dedicated abdominal radiograph may
be obtained as indicated.
